# Patient Record
Sex: Male | Born: 1990 | Race: Black or African American | Hispanic: No | Marital: Single | Smoking: Current every day smoker
Health system: Southern US, Community
[De-identification: ages and names within clinical notes are randomized; demographics above are authoritative.]

## PROBLEM LIST (undated history)

## (undated) ENCOUNTER — Emergency Department (HOSPITAL_COMMUNITY): Admission: EM | Payer: Self-pay | Source: Home / Self Care

## (undated) DIAGNOSIS — F319 Bipolar disorder, unspecified: Secondary | ICD-10-CM

## (undated) DIAGNOSIS — F209 Schizophrenia, unspecified: Secondary | ICD-10-CM

## (undated) DIAGNOSIS — R569 Unspecified convulsions: Secondary | ICD-10-CM

---
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
## 2008-02-26 ENCOUNTER — Emergency Department (HOSPITAL_COMMUNITY): Admission: EM | Admit: 2008-02-26 | Discharge: 2008-02-26 | Payer: Self-pay | Admitting: Emergency Medicine

## 2008-02-28 ENCOUNTER — Emergency Department (HOSPITAL_COMMUNITY): Admission: EM | Admit: 2008-02-28 | Discharge: 2008-02-28 | Payer: Self-pay | Admitting: Emergency Medicine

## 2008-03-07 ENCOUNTER — Emergency Department (HOSPITAL_COMMUNITY): Admission: EM | Admit: 2008-03-07 | Discharge: 2008-03-07 | Payer: Self-pay | Admitting: Emergency Medicine

## 2008-03-08 ENCOUNTER — Emergency Department (HOSPITAL_COMMUNITY): Admission: EM | Admit: 2008-03-08 | Discharge: 2008-03-08 | Payer: Self-pay | Admitting: Emergency Medicine

## 2010-03-09 ENCOUNTER — Emergency Department (HOSPITAL_COMMUNITY): Admission: EM | Admit: 2010-03-09 | Discharge: 2010-03-09 | Payer: Self-pay | Admitting: Emergency Medicine

## 2010-09-06 LAB — URINALYSIS, ROUTINE W REFLEX MICROSCOPIC
Glucose, UA: NEGATIVE mg/dL
Specific Gravity, Urine: 1.025 (ref 1.005–1.030)
Urobilinogen, UA: 4 mg/dL — ABNORMAL HIGH (ref 0.0–1.0)
pH: 6 (ref 5.0–8.0)

## 2010-09-06 LAB — COMPREHENSIVE METABOLIC PANEL
AST: 30 U/L (ref 0–37)
Albumin: 4.6 g/dL (ref 3.5–5.2)
Alkaline Phosphatase: 52 U/L (ref 39–117)
BUN: 12 mg/dL (ref 6–23)
CO2: 24 mEq/L (ref 19–32)
Calcium: 10 mg/dL (ref 8.4–10.5)
Creatinine, Ser: 0.99 mg/dL (ref 0.4–1.5)
GFR calc Af Amer: >60 mL/min (ref >60)
GFR calc non Af Amer: >60 mL/min (ref >60)
Potassium: 4.1 mEq/L (ref 3.5–5.1)

## 2010-09-06 LAB — CBC
HCT: 46.2 % (ref 39.0–52.0)
MCH: 31.2 pg (ref 26.0–34.0)
MCHC: 34.3 g/dL (ref 30.0–36.0)
MCV: 90.9 fL (ref 78.0–100.0)
WBC: 7.4 10*3/uL (ref 4.0–10.5)

## 2010-09-06 LAB — POCT CARDIAC MARKERS
Myoglobin, poc: 113 ng/mL (ref 12–200)
Troponin i, poc: <0.05 ng/mL (ref 0.00–0.09)

## 2010-09-06 LAB — DIFFERENTIAL
Basophils Absolute: 0.1 10*3/uL (ref 0.0–0.1)
Basophils Relative: 1 % (ref 0–1)
Eosinophils Relative: 1 % (ref 0–5)
Monocytes Relative: 10 % (ref 3–12)

## 2010-09-06 LAB — RAPID URINE DRUG SCREEN, HOSP PERFORMED
Amphetamines: NOT DETECTED
Barbiturates: NOT DETECTED
Benzodiazepines: NOT DETECTED
Tetrahydrocannabinol: POSITIVE — AB

## 2011-02-28 ENCOUNTER — Emergency Department (HOSPITAL_COMMUNITY)
Admission: EM | Admit: 2011-02-28 | Discharge: 2011-02-28 | Discharge status: Against Medical Advice | Payer: Self-pay | Attending: Emergency Medicine | Admitting: Emergency Medicine

## 2011-02-28 DIAGNOSIS — Z5320 Procedure and treatment not carried out because of patient's decision for unspecified reasons: Secondary | ICD-10-CM | POA: Insufficient documentation

## 2011-02-28 DIAGNOSIS — R21 Rash and other nonspecific skin eruption: Secondary | ICD-10-CM | POA: Insufficient documentation

## 2011-02-28 HISTORY — DX: Unspecified convulsions: R56.9

## 2011-02-28 NOTE — ED Notes (Signed)
Pt reports has  Multiple "raw spots" to testicles for approx 1 month.  C/O burning.

## 2011-02-28 NOTE — ED Notes (Signed)
Spoke with PA. Was told pt would most likely be seen by 1300 doctor coming in. This was relayed to pt after pt spoke to RN about extended wait. Pt wished to sign out AMA. Pt ambulated out with significant other, upset. Apologized to pt fir extended wait.

## 2011-03-01 ENCOUNTER — Emergency Department (HOSPITAL_COMMUNITY)
Admission: EM | Admit: 2011-03-01 | Discharge: 2011-03-01 | Disposition: A | Discharge status: Home / Self Care | Payer: Self-pay | Attending: Emergency Medicine | Admitting: Emergency Medicine

## 2011-03-01 ENCOUNTER — Encounter (HOSPITAL_COMMUNITY): Payer: Self-pay | Admitting: Emergency Medicine

## 2011-03-01 DIAGNOSIS — J45909 Unspecified asthma, uncomplicated: Secondary | ICD-10-CM | POA: Insufficient documentation

## 2011-03-01 DIAGNOSIS — A57 Chancroid: Secondary | ICD-10-CM | POA: Insufficient documentation

## 2011-03-01 DIAGNOSIS — F17200 Nicotine dependence, unspecified, uncomplicated: Secondary | ICD-10-CM | POA: Insufficient documentation

## 2011-03-01 LAB — URINALYSIS, ROUTINE W REFLEX MICROSCOPIC
Bilirubin Urine: NEGATIVE
Glucose, UA: NEGATIVE mg/dL
Hgb urine dipstick: NEGATIVE
Specific Gravity, Urine: 1.015 (ref 1.005–1.030)
pH: 7 (ref 5.0–8.0)

## 2011-03-01 LAB — URINE MICROSCOPIC-ADD ON

## 2011-03-01 MED ORDER — DOXYCYCLINE HYCLATE 100 MG PO CAPS: 100.0000 mg | ORAL_CAPSULE | Freq: Two times a day (BID) | ORAL | Status: AC

## 2011-03-01 MED ORDER — PENICILLIN G BENZATHINE 1200000 UNIT/2ML IM SUSP
2.4000 10*6.[IU] | Freq: Once | INTRAMUSCULAR | Status: AC
  Administered 2011-03-01: 2.4 10*6.[IU] via INTRAMUSCULAR
  Filled 2011-03-01: qty 4

## 2011-03-01 MED ORDER — AZITHROMYCIN 250 MG PO TABS
1000.0000 mg | ORAL_TABLET | Freq: Once | ORAL | Status: AC
  Administered 2011-03-01: 1000 mg via ORAL
  Filled 2011-03-01: qty 4

## 2011-03-01 MED ORDER — CEFTRIAXONE SODIUM 250 MG IJ SOLR
250.0000 mg | Freq: Once | INTRAMUSCULAR | Status: AC
  Administered 2011-03-01: 250 mg via INTRAMUSCULAR
  Filled 2011-03-01: qty 250

## 2011-03-01 NOTE — ED Provider Notes (Signed)
History     CSN: [id number] Arrival date & time: 03/01/2011  2:02 PM  Chief Complaint  Patient presents with  . Rash   HPI Comments: Sex partner denies any sx's.  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  Patient is a 20 y.o. male presenting with rash. The history is provided by the patient. No language interpreter was used.  Rash  This is a new problem. Episode onset: over one month ago. The problem has been gradually worsening. Associated with: "occassional penile D/c. There has been no fever. The pain is at a severity of 4/10. The pain has been constant since onset. Associated symptoms include pain.    Past Medical History  Diagnosis Date  . Seizures   . Asthma     History reviewed. No pertinent past surgical history.  History reviewed. No pertinent family history.  History  Substance Use Topics  . Smoking status: Current Everyday Smoker  . Smokeless tobacco: Not on file  . Alcohol Use: No      Review of Systems  Skin: Positive for rash.       Scrotal lesions.  All other systems reviewed and are negative.    Physical Exam  BP 132/70  Pulse 70  Temp(Src) 98.1 F (36.7 C) (Oral)  Resp 18  Physical Exam  Nursing note and vitals reviewed. Constitutional: He is oriented to person, place, and time. Vital signs are normal. He appears well-developed and well-nourished. No distress.  HENT:  Head: Normocephalic and atraumatic.  Right Ear: External ear normal.  Left Ear: External ear normal.  Nose: Nose normal.  Mouth/Throat: No oropharyngeal exudate.  Eyes: Conjunctivae and EOM are normal. Pupils are equal, round, and reactive to light. Right eye exhibits no discharge. Left eye exhibits no discharge. No scleral icterus.  Neck: Normal range of motion. Neck supple. No JVD present. No tracheal deviation present. No thyromegaly present.  Cardiovascular: Normal rate, regular rhythm, normal heart sounds, intact distal pulses and normal pulses.  Exam reveals no gallop and no  friction rub.   No murmur heard. Pulmonary/Chest: Effort normal and breath sounds normal. No stridor. No respiratory distress. He has no wheezes. He has no rales. He exhibits no tenderness.  Abdominal: Soft. Normal appearance and bowel sounds are normal. He exhibits no distension and no mass. There is no tenderness. There is no rebound and no guarding.  Genitourinary: Testes normal and penis normal.    No penile tenderness.  Musculoskeletal: Normal range of motion. He exhibits no edema and no tenderness.  Lymphadenopathy:    He has no cervical adenopathy.       Right: Inguinal adenopathy present.       Left: Inguinal adenopathy present.  Neurological: He is alert and oriented to person, place, and time. He has normal reflexes. No cranial nerve deficit. Coordination normal. GCS eye subscore is 4. GCS verbal subscore is 5. GCS motor subscore is 6.  Skin: Skin is warm and dry. No rash noted. He is not diaphoretic.  Psychiatric: He has a normal mood and affect. His speech is normal and behavior is normal. Judgment and thought content normal. Cognition and memory are normal.    ED Course  Procedures  MDM       Worthy Rancher, PA 03/01/11 1749

## 2011-03-01 NOTE — ED Notes (Signed)
Pt reports rash to scrotum x 1 month.

## 2011-03-02 NOTE — ED Provider Notes (Signed)
Medical screening examination/treatment/procedure(s) were performed by non-physician practitioner and as supervising physician I was immediately available for consultation/collaboration.   Palin Tristan M Lyssa Hackley, DO 03/02/11 1445 

## 2011-03-06 NOTE — ED Notes (Signed)
Results received from [facility].  (+) Gonorrhea & (+) Chlamydia.  Treated with Zithromax and Rocephin in ED & Rx for Doxycycline.  [employer] form completed x 2 and faxed.  Call and notify patient.

## 2011-03-27 LAB — URINALYSIS, ROUTINE W REFLEX MICROSCOPIC
Glucose, UA: NEGATIVE
Ketones, ur: NEGATIVE
Nitrite: NEGATIVE
Specific Gravity, Urine: <1.005 — ABNORMAL LOW
pH: 6

## 2011-03-27 LAB — BASIC METABOLIC PANEL
Calcium: 9.7
GFR calc non Af Amer: >60
Potassium: 4.1
Sodium: 139

## 2011-03-27 LAB — CBC
HCT: 48.3
Hemoglobin: 16.4
Platelets: 234
WBC: 5.7

## 2011-03-27 LAB — DIFFERENTIAL
Basophils Absolute: 0
Eosinophils Relative: 2
Lymphocytes Relative: 22
Lymphs Abs: 1.3
Monocytes Absolute: 0.5
Neutro Abs: 3.9

## 2011-10-21 ENCOUNTER — Emergency Department (HOSPITAL_COMMUNITY): Payer: Self-pay

## 2011-10-21 ENCOUNTER — Encounter (HOSPITAL_COMMUNITY): Payer: Self-pay | Admitting: Emergency Medicine

## 2011-10-21 ENCOUNTER — Emergency Department (HOSPITAL_COMMUNITY)
Admission: EM | Admit: 2011-10-21 | Discharge: 2011-10-21 | Disposition: A | Discharge status: Home / Self Care | Payer: Self-pay | Attending: Emergency Medicine | Admitting: Emergency Medicine

## 2011-10-21 DIAGNOSIS — K648 Other hemorrhoids: Secondary | ICD-10-CM | POA: Insufficient documentation

## 2011-10-21 DIAGNOSIS — R51 Headache: Secondary | ICD-10-CM | POA: Insufficient documentation

## 2011-10-21 DIAGNOSIS — K5900 Constipation, unspecified: Secondary | ICD-10-CM | POA: Insufficient documentation

## 2011-10-21 HISTORY — DX: Schizophrenia, unspecified: F20.9

## 2011-10-21 HISTORY — DX: Bipolar disorder, unspecified: F31.9

## 2011-10-21 LAB — BASIC METABOLIC PANEL
CO2: 32 mEq/L (ref 19–32)
Chloride: 100 mEq/L (ref 96–112)
Creatinine, Ser: 0.92 mg/dL (ref 0.50–1.35)
GFR calc Af Amer: >90 mL/min (ref >90)
Potassium: 3.7 mEq/L (ref 3.5–5.1)
Sodium: 140 mEq/L (ref 135–145)

## 2011-10-21 MED ORDER — ACETAMINOPHEN 325 MG PO TABS
650.0000 mg | ORAL_TABLET | Freq: Once | ORAL | Status: AC
  Administered 2011-10-21: 650 mg via ORAL
  Filled 2011-10-21: qty 2

## 2011-10-21 MED ORDER — POLYETHYLENE GLYCOL 3350 17 GM/SCOOP PO POWD: 17.0000 g | Freq: Every day | ORAL | Status: AC

## 2011-10-21 MED ORDER — HYDROCORTISONE 2.5 % RE CREA: TOPICAL_CREAM | RECTAL | Status: AC

## 2011-10-21 NOTE — ED Notes (Signed)
Patient with no complaints at this time. Respirations even and unlabored. Skin warm/dry. Discharge instructions reviewed with patient at this time. Patient given opportunity to voice concerns/ask questions. Patient discharged at this time and left Emergency Department with steady gait.   

## 2011-10-21 NOTE — Discharge Instructions (Signed)
Constipation in Adults Constipation is having fewer than 2 bowel movements per week. Usually, the stools are hard. As we grow older, constipation is more common. If you try to fix constipation with laxatives, the problem may get worse. This is because laxatives taken over a long period of time make the colon muscles weaker. A low-fiber diet, not taking in enough fluids, and taking some medicines may make these problems worse. MEDICATIONS THAT MAY CAUSE CONSTIPATION  Water pills (diuretics).   Calcium channel blockers (used to control blood pressure and for the heart).   Certain pain medicines (narcotics).   Anticholinergics.   Anti-inflammatory agents.   Antacids that contain aluminum.  DISEASES THAT CONTRIBUTE TO CONSTIPATION  Diabetes.   Parkinson's disease.   Dementia.   Stroke.   Depression.   Illnesses that cause problems with salt and water metabolism.  HOME CARE INSTRUCTIONS   Constipation is usually best cared for without medicines. Increasing dietary fiber and eating more fruits and vegetables is the best way to manage constipation.   Slowly increase fiber intake to 25 to 38 grams per day. Whole grains, fruits, vegetables, and legumes are good sources of fiber. A dietitian can further help you incorporate high-fiber foods into your diet.   Drink enough water and fluids to keep your urine clear or pale yellow.   A fiber supplement may be added to your diet if you cannot get enough fiber from foods.   Increasing your activities also helps improve regularity.   Suppositories, as suggested by your caregiver, will also help. If you are using antacids, such as aluminum or calcium containing products, it will be helpful to switch to products containing magnesium if your caregiver says it is okay.   If you have been given a liquid injection (enema) today, this is only a temporary measure. It should not be relied on for treatment of longstanding (chronic) constipation.    Stronger measures, such as magnesium sulfate, should be avoided if possible. This may cause uncontrollable diarrhea. Using magnesium sulfate may not allow you time to make it to the bathroom.  SEEK IMMEDIATE MEDICAL CARE IF:   There is bright red blood in the stool.   The constipation stays for more than 4 days.   There is belly (abdominal) or rectal pain.   You do not seem to be getting better.   You have any questions or concerns.  MAKE SURE YOU:   Understand these instructions.   Will watch your condition.   Will get help right away if you are not doing well or get worse.  Document Released: 03/08/2004 Document Revised: 05/30/2011 Document Reviewed: 05/14/2011 [facility] Patient Information 2012 [place], [place].   Please take  the medications prescribed for your constipation and for the internal hemorrhoids which I suspect you may have.  Increase fluid intake, make sure you are getting plenty of fruits and vegetables and whole gr foods which will also help with constipation.  Please call Dr. Darrick Penna for further evaluation if your symptoms persist.

## 2011-10-21 NOTE — ED Notes (Signed)
Pt states having trouble going to bathroom for several months. Pt states today has headache and abd pain.

## 2011-10-22 NOTE — ED Provider Notes (Signed)
History     CSN: [id number]  Arrival date & time 10/21/11  7673   First MD Initiated Contact with Patient 10/21/11 1006      Chief Complaint  Patient presents with  . Constipation  . Headache    (Consider location/radiation/quality/duration/timing/severity/associated sxs/prior treatment) HPI Comments: Patient presents for evaluation of constipation which has been present for several months.  He also describes intermittent episodes of a "knot" at his rectum which is painful and is worse after a constipated bowel movement.  He has been treated for a hemorrhoid in the past and has used a suppository medication which he states did not improve his symptoms.  He did not take any medicines currently for his constipation.  He also has complaints of a frontal headache which started today.  He does have intermittent crampy lower abdominal pain which is worse prior to need for a bowel movement, he does not have pain at this time.  His last bowel movement was one week ago.  He denies fevers and chills, has had no mucus or blood in his bowel movements.  Patient is a 21 y.o. male presenting with constipation and headaches. The history is provided by the patient.  Constipation  Associated symptoms include rectal pain and headaches. Pertinent negatives include no fever, no abdominal pain, no nausea, no chest pain and no rash.  Headache  Pertinent negatives include no fever, no shortness of breath and no nausea.    Past Medical History  Diagnosis Date  . Seizures   . Asthma   . Bipolar disorder   . Schizophrenia     History reviewed. No pertinent past surgical history.  History reviewed. No pertinent family history.  History  Substance Use Topics  . Smoking status: Current Everyday Smoker  . Smokeless tobacco: Not on file  . Alcohol Use: No      Review of Systems  Constitutional: Negative for fever.  HENT: Negative for congestion, sore throat and neck pain.   Eyes: Negative.     Respiratory: Negative for chest tightness and shortness of breath.   Cardiovascular: Negative for chest pain.  Gastrointestinal: Positive for constipation and rectal pain. Negative for nausea and abdominal pain.  Genitourinary: Negative.   Musculoskeletal: Negative for joint swelling and arthralgias.  Skin: Negative.  Negative for rash and wound.  Neurological: Positive for headaches. Negative for dizziness, weakness, light-headedness and numbness.  Hematological: Negative.   Psychiatric/Behavioral: Negative.     Allergies  Review of patient's allergies indicates no known allergies.  Home Medications   Current Outpatient Rx  Name Route Sig Dispense Refill  . HYDROCORTISONE 2.5 % RE CREA  Apply rectally 2 times daily after warm tub soak 20 g 0  . POLYETHYLENE GLYCOL 3350 PO POWD Oral Take 17 g by mouth daily. 527 g 0    BP 114/97  Pulse 77  Temp(Src) 98.6 F (37 C) (Oral)  Resp 18  Ht 5\' 10"  (1.778 m)  Wt 160 lb (72.576 kg)  BMI 22.96 kg/m2  SpO2 99%  Physical Exam  Nursing note and vitals reviewed. Constitutional: He appears well-developed and well-nourished.  HENT:  Head: Normocephalic and atraumatic.  Eyes: Conjunctivae are normal.  Neck: Normal range of motion.  Cardiovascular: Normal rate, regular rhythm, normal heart sounds and intact distal pulses.   Pulmonary/Chest: Effort normal and breath sounds normal. He has no wheezes.  Abdominal: Soft. Bowel sounds are normal. He exhibits no distension and no mass. There is no tenderness. There is no rebound and  no guarding.  Genitourinary: Rectal exam shows no external hemorrhoid, no fissure, no mass, no tenderness and anal tone normal.       No fecal impaction appreciated.  Also unable to appreciate internal hemorrhoid at this time.  No external hemorrhoid present.  Musculoskeletal: Normal range of motion.  Neurological: He is alert. He has normal strength. No cranial nerve deficit or sensory deficit. Gait normal. GCS eye  subscore is 4. GCS verbal subscore is 5. GCS motor subscore is 6.  Skin: Skin is warm and dry.  Psychiatric: He has a normal mood and affect.    ED Course  Procedures (including critical care time)   Labs Reviewed  BASIC METABOLIC PANEL  LAB REPORT - SCANNED   Dg Abd 2 Views  10/21/2011  *RADIOLOGY REPORT*  Clinical Data: Constipation, rectal pain  ABDOMEN - 2 VIEW  Comparison: None.  Findings: There is nonspecific nonobstructive bowel gas pattern. Moderate stool noted in the hepatic flexure of the colon and transverse colon.  Some stool noted in the descending colon. Pelvic phleboliths are noted.  No free abdominal air.  IMPRESSION: 1.  Nonspecific nonobstructive bowel gas pattern.  Moderate stool noted in the hepatic flexure of the colon and transverse colon. Some stool noted in descending colon. No free abdominal air.  Original Report Authenticated By: Natasha Mead, M.D.     1. Constipation       MDM  X-ray reviewed prior to discharge home.  Although unable to appreciate internal hemorrhoid on today's exam, history suggests this probability.  Patient was prescribed Anusol suppositories and recommended warm sitz baths twice a day prior to suppository treatment.  Also prescribed MiraLax for constipation.  Referral to GI given for further management of symptoms.        Burgess Amor, [place] 10/22/11 1731

## 2011-10-23 NOTE — ED Provider Notes (Signed)
Medical screening examination/treatment/procedure(s) were performed by non-physician practitioner and as supervising physician I was immediately available for consultation/collaboration.   Junell Cullifer, MD 10/23/11 0647 

## 2015-09-04 ENCOUNTER — Emergency Department (HOSPITAL_COMMUNITY)
Admission: EM | Admit: 2015-09-04 | Discharge: 2015-09-04 | Disposition: A | Discharge status: Home / Self Care | Payer: Self-pay | Attending: Emergency Medicine | Admitting: Emergency Medicine

## 2015-09-04 ENCOUNTER — Encounter (HOSPITAL_COMMUNITY): Payer: Self-pay | Admitting: *Deleted

## 2015-09-04 DIAGNOSIS — F319 Bipolar disorder, unspecified: Secondary | ICD-10-CM | POA: Insufficient documentation

## 2015-09-04 DIAGNOSIS — M62838 Other muscle spasm: Secondary | ICD-10-CM | POA: Insufficient documentation

## 2015-09-04 DIAGNOSIS — J45909 Unspecified asthma, uncomplicated: Secondary | ICD-10-CM | POA: Insufficient documentation

## 2015-09-04 DIAGNOSIS — F17200 Nicotine dependence, unspecified, uncomplicated: Secondary | ICD-10-CM | POA: Insufficient documentation

## 2015-09-04 LAB — I-STAT TROPONIN, ED: Troponin i, poc: 0 ng/mL (ref 0.00–0.08)

## 2015-09-04 LAB — I-STAT CHEM 8, ED
BUN: 13 mg/dL (ref 6–20)
CALCIUM ION: 1.2 mmol/L (ref 1.12–1.23)
CHLORIDE: 98 mmol/L — AB (ref 101–111)
CREATININE: 0.9 mg/dL (ref 0.61–1.24)
GLUCOSE: 84 mg/dL (ref 65–99)
HCT: 48 % (ref 39.0–52.0)
Hemoglobin: 16.3 g/dL (ref 13.0–17.0)
Potassium: 3.8 mmol/L (ref 3.5–5.1)
SODIUM: 141 mmol/L (ref 135–145)
TCO2: 30 mmol/L (ref 0–100)

## 2015-09-04 NOTE — ED Notes (Signed)
Patient verbalizes understanding of discharge instructions, home care and follow up care if needed. Patient out of department at this time. 

## 2015-09-04 NOTE — ED Notes (Signed)
Patient c/o left neck pain that radiated into left chest and left arm for "a few days" patient denies dizziness, nausea, vomiting or weakness. A&OX4 at this time, visitor at bedside.

## 2015-09-04 NOTE — ED Provider Notes (Signed)
CSN: [id number]     Arrival date & time 09/04/15  1946 History   First MD Initiated Contact with Patient 09/04/15 2009     Chief Complaint  Patient presents with  . Chest Pain      HPI Pt was seen at 2015. Per pt, c/o gradual onset and persistence of constant left sided neck "spasms" that began 3 days ago. Pt states his left neck muscle "feels like it cramps up then lets go." Pt states his symptoms began after he "began sleeping on a really flat pillow." Denies injury, no fevers, no rash, no CP/SOB, no focal motor weakness, no tingling/numbness in extremities, no abd pain, no N/V/D, no dysphagia, no sore throat.    Past Medical History  Diagnosis Date  . Seizures (HCC)   . Asthma   . Bipolar disorder (HCC)   . Schizophrenia (HCC)    History reviewed. No pertinent past surgical history.  Social History  Substance Use Topics  . Smoking status: Current Every Day Smoker  . Smokeless tobacco: None  . Alcohol Use: No    Review of Systems ROS: Statement: All systems negative except as marked or noted in the HPI; Constitutional: Negative for fever and chills. ; ; Eyes: Negative for eye pain, redness and discharge. ; ; ENMT: Negative for ear pain, hoarseness, nasal congestion, sinus pressure and sore throat. ; ; Cardiovascular: Negative for chest pain, palpitations, diaphoresis, dyspnea and peripheral edema. ; ; Respiratory: Negative for cough, wheezing and stridor. ; ; Gastrointestinal: Negative for nausea, vomiting, diarrhea, abdominal pain, blood in stool, hematemesis, jaundice and rectal bleeding. . ; ; Genitourinary: Negative for dysuria, flank pain and hematuria. ; ; Musculoskeletal: +"muscle spasm." Negative for back pain and neck pain. Negative for swelling and trauma.; ; Skin: Negative for pruritus, rash, abrasions, blisters, bruising and skin lesion.; ; Neuro: Negative for headache, lightheadedness and neck stiffness. Negative for weakness, altered level of consciousness , altered  mental status, extremity weakness, paresthesias, involuntary movement, seizure and syncope.      Allergies  Review of patient's allergies indicates no known allergies.  Home Medications   Prior to Admission medications   Not on File   BP 108/82 mmHg  Pulse 97  Temp(Src) 98.2 F (36.8 C) (Oral)  Resp 18  Wt 150 lb 11.2 oz (68.357 kg)  SpO2 100% Physical Exam  2050: Physical examination:  Nursing notes reviewed; Vital signs and O2 SAT reviewed;  Constitutional: Well developed, Well nourished, Well hydrated, In no acute distress; Head:  Normocephalic, atraumatic; Eyes: EOMI, PERRL, No scleral icterus; ENMT: Mouth and pharynx normal, Mucous membranes moist; Neck: Supple, Full range of motion, No lymphadenopathy. No palp thrill, no bruit. No rash. No edema, no ecchymosis.; Cardiovascular: Regular rate and rhythm, No murmur, rub, or gallop; Respiratory: Breath sounds clear & equal bilaterally, No rales, rhonchi, wheezes.  Speaking full sentences with ease, Normal respiratory effort/excursion; Chest: Nontender, Movement normal; Abdomen: Soft, Nontender, Nondistended, Normal bowel sounds; Genitourinary: No CVA tenderness; Spine:  No midline CS, TS, LS tenderness.;; Extremities: Pulses normal, No tenderness, No edema, No calf edema or asymmetry.; Neuro: AA&Ox3, Major CN grossly intact.  Speech clear. No gross focal motor or sensory deficits in extremities. Climbs on and off stretcher easily by himself. Gait steady.; Skin: Color normal, Warm, Dry.   ED Course  Procedures (including critical care time) Labs Review  Imaging Review  I have personally reviewed and evaluated these images and lab results as part of my medical decision-making.   EKG  Interpretation   Date/Time:  Monday September 04 2015 19:57:29 EDT Ventricular Rate:  74 PR Interval:  126 QRS Duration: 102 QT Interval:  362 QTC Calculation: 401 R Axis:   86 Text Interpretation:  Normal sinus rhythm with sinus arrhythmia RSR' or  QR  pattern in V1 suggests right ventricular conduction delay When compared  with ECG of 03/09/2010 No significant change was found Confirmed by [facility]   MD, Nicholos JohnsKATHLEEN [phone]([id number]) on 09/04/2015 8:33:20 PM      MDM  MDM Reviewed: previous chart, nursing note and vitals Reviewed previous: labs and ECG Interpretation: labs and ECG     Results for orders placed or performed during the hospital encounter of 09/04/15  I-stat Chem 8, ED  Result Value Ref Range   Sodium 141 135 - 145 mmol/L   Potassium 3.8 3.5 - 5.1 mmol/L   Chloride 98 (L) 101 - 111 mmol/L   BUN 13 6 - 20 mg/dL   Creatinine, Ser 7.840.90 0.61 - 1.24 mg/dL   Glucose, Bld 84 65 - 99 mg/dL   Calcium, Ion 6.961.20 1.12 - 1.23 mmol/L   TCO2 30 0 - 100 mmol/L   Hemoglobin 16.3 13.0 - 17.0 g/dL   HCT 29.548.0 28.439.0 - 13.252.0 %  I-stat troponin, ED  Result Value Ref Range   Troponin i, poc 0.00 0.00 - 0.08 ng/mL   Comment 3            2105:  Workup and exam reassuring. Doubt acute vascular issue as cause for pain; as pt is TTP over left SCM muscle. Pt states he "feels better" and wants to go home now. Tx symptomatically. Dx and testing d/w pt and family.  Questions answered.  Verb understanding, agreeable to d/c home with outpt f/u.     Samuel JesterKathleen Rhaya Coale, DO 09/08/15 1720

## 2015-09-04 NOTE — ED Notes (Signed)
Pt c/o neck pain that radiates down left side of chest and down left arm; pt states "I just don't feel right"'

## 2015-09-04 NOTE — Discharge Instructions (Signed)
Take over the counter tylenol and ibuprofen, as directed on packaging, as needed for discomfort.  Apply moist heat or ice to the area(s) of discomfort, for 15 minutes at a time, several times per day for the next few days.  Do not fall asleep on a heating or ice pack.  Call your regular medical doctor tomorrow to schedule a follow up appointment in the next 2 to 3 days.  Return to the Emergency Department immediately if worsening. ° ° °

## 2021-03-17 ENCOUNTER — Encounter (HOSPITAL_COMMUNITY): Payer: Self-pay

## 2021-03-17 ENCOUNTER — Emergency Department (HOSPITAL_COMMUNITY)
Admission: EM | Admit: 2021-03-17 | Discharge: 2021-03-17 | Disposition: A | Discharge status: Home / Self Care | Payer: Self-pay | Attending: Emergency Medicine | Admitting: Emergency Medicine

## 2021-03-17 ENCOUNTER — Other Ambulatory Visit: Payer: Self-pay

## 2021-03-17 DIAGNOSIS — Z202 Contact with and (suspected) exposure to infections with a predominantly sexual mode of transmission: Secondary | ICD-10-CM | POA: Insufficient documentation

## 2021-03-17 DIAGNOSIS — Z113 Encounter for screening for infections with a predominantly sexual mode of transmission: Secondary | ICD-10-CM

## 2021-03-17 DIAGNOSIS — F17210 Nicotine dependence, cigarettes, uncomplicated: Secondary | ICD-10-CM | POA: Insufficient documentation

## 2021-03-17 DIAGNOSIS — J45909 Unspecified asthma, uncomplicated: Secondary | ICD-10-CM | POA: Insufficient documentation

## 2021-03-17 LAB — RAPID HIV SCREEN (HIV 1/2 AB+AG)
HIV 1/2 Antibodies: NONREACTIVE
HIV-1 P24 Antigen - HIV24: NONREACTIVE

## 2021-03-17 NOTE — ED Provider Notes (Addendum)
[facility] EMERGENCY DEPARTMENT Provider Note   CSN: [id number] Arrival date & time: 03/17/21  1840     History Chief Complaint  Patient presents with   Exposure to STD    Cody Massey is a 30 y.o. male.  Pt reports he wants to get checked.  Pt denies any symptoms.  Pt denies partner having any symptoms.  No denies discharge   The history is provided by the patient. No language interpreter was used.  Exposure to STD This is a new problem. The current episode started more than 2 days ago. The problem occurs constantly. The problem has not changed since onset.Pertinent negatives include no abdominal pain and no headaches. Nothing aggravates the symptoms. Nothing relieves the symptoms. He has tried nothing for the symptoms. The treatment provided no relief.      Past Medical History:  Diagnosis Date   Asthma    Bipolar disorder (HCC)    Schizophrenia (HCC)    Seizures (HCC)     There are no problems to display for this patient.   History reviewed. No pertinent surgical history.     No family history on file.  Social History   Tobacco Use   Smoking status: Every Day    Packs/day: 1.00    Types: Cigarettes   Smokeless tobacco: Never  Vaping Use   Vaping Use: Never used  Substance Use Topics   Alcohol use: No   Drug use: Yes    Types: Marijuana    Home Medications Prior to Admission medications   Not on File    Allergies    Patient has no known allergies.  Review of Systems   Review of Systems  Gastrointestinal:  Negative for abdominal pain.  Neurological:  Negative for headaches.  All other systems reviewed and are negative.  Physical Exam Updated Vital Signs BP 122/72 (BP Location: Right Arm)   Pulse 82   Temp 98.4 F (36.9 C) (Oral)   Resp 18   Ht 5\' 11"  (1.803 m)   Wt 74.3 kg   SpO2 98%   BMI 22.85 kg/m   Physical Exam Vitals reviewed.  Cardiovascular:     Rate and Rhythm: Normal rate.  Pulmonary:     Effort: Pulmonary effort  is normal.  Genitourinary:    Penis: Normal.   Musculoskeletal:        General: Normal range of motion.  Skin:    General: Skin is warm.  Neurological:     General: No focal deficit present.     Mental Status: He is alert.  Psychiatric:        Mood and Affect: Mood normal.    ED Results / Procedures / Treatments   Labs (all labs ordered are listed, but only abnormal results are displayed) Labs Reviewed  HIV ANTIBODY (ROUTINE TESTING W REFLEX)  RAPID HIV SCREEN (HIV 1/2 AB+AG)  GC/CHLAMYDIA PROBE AMP ([place]) NOT AT [facility]    EKG None  Radiology No results found.  Procedures Procedures   Medications Ordered in ED Medications - No data to display  ED Course  I have reviewed the triage vital signs and the nursing notes.  Pertinent labs & imaging results that were available during my care of the patient were reviewed by me and considered in my medical decision making (see chart for details).    MDM Rules/Calculators/A&P  GC/Ct/ RPR and HIv ordered  Final Clinical Impression(s) / ED Diagnoses Final diagnoses:  Screen for STD (sexually transmitted disease)    Rx / DC Orders ED Discharge Orders     None     An After Visit Summary was printed and given to the patient.    Elson Areas, PA-C 03/17/21 2247    Elson Areas, PA-C 03/17/21 2248    Bethann Berkshire, MD 03/19/21 1151

## 2021-03-17 NOTE — ED Triage Notes (Signed)
Pt here from home for STD check- pt denies symptoms, "just wants to be checked"

## 2021-03-18 LAB — HIV ANTIBODY (ROUTINE TESTING W REFLEX): HIV Screen 4th Generation wRfx: NONREACTIVE

## 2021-03-19 LAB — GC/CHLAMYDIA PROBE AMP (~~LOC~~) NOT AT ARMC
Chlamydia: NEGATIVE
Comment: NEGATIVE
Comment: NORMAL
Neisseria Gonorrhea: NEGATIVE

## 2022-02-09 ENCOUNTER — Emergency Department (HOSPITAL_COMMUNITY)
Admission: EM | Admit: 2022-02-09 | Discharge: 2022-02-09 | Discharge status: Against Medical Advice | Payer: 59 | Attending: Emergency Medicine | Admitting: Emergency Medicine

## 2022-02-09 ENCOUNTER — Encounter (HOSPITAL_COMMUNITY): Payer: Self-pay

## 2022-02-09 DIAGNOSIS — Z5321 Procedure and treatment not carried out due to patient leaving prior to being seen by health care provider: Secondary | ICD-10-CM | POA: Diagnosis not present

## 2022-02-09 DIAGNOSIS — K0889 Other specified disorders of teeth and supporting structures: Secondary | ICD-10-CM | POA: Insufficient documentation

## 2022-02-09 NOTE — ED Triage Notes (Signed)
Pt states that he is having right lower sided dental pain x weeks. Pt has not seen a dentist in a while- would like referral.

## 2022-05-18 ENCOUNTER — Other Ambulatory Visit: Payer: Self-pay

## 2022-05-18 ENCOUNTER — Emergency Department (HOSPITAL_COMMUNITY)
Admission: EM | Admit: 2022-05-18 | Discharge: 2022-05-18 | Discharge status: Against Medical Advice | Payer: 59 | Attending: Emergency Medicine | Admitting: Emergency Medicine

## 2022-05-18 DIAGNOSIS — Z5329 Procedure and treatment not carried out because of patient's decision for other reasons: Secondary | ICD-10-CM | POA: Insufficient documentation

## 2022-05-18 DIAGNOSIS — K0889 Other specified disorders of teeth and supporting structures: Secondary | ICD-10-CM | POA: Insufficient documentation

## 2022-05-18 NOTE — ED Triage Notes (Signed)
Pt c/o pain to lower left teeth, states its been hurting for about a year. Denies seeing a dentist.

## 2022-05-18 NOTE — ED Provider Notes (Signed)
  [facility] EMERGENCY DEPARTMENT Provider Note   CSN: [id number] Arrival date & time: 05/18/22  0340     History  Chief Complaint  Patient presents with   Dental Pain    Cody Massey is a 31 y.o. male.  Patient is a 31 year old male presenting with dental pain ongoing for the past year.  He denies any fevers or chills.  He denies any difficulty breathing or swallowing.  He does report pain with eating or drinking.  The history is provided by the patient.       Home Medications Prior to Admission medications   Not on File      Allergies    Patient has no known allergies.    Review of Systems   Review of Systems  All other systems reviewed and are negative.   Physical Exam Updated Vital Signs BP (!) 143/82   Pulse 72   Temp 98 F (36.7 C) (Oral)   Resp 17   Ht 5\' 6"  (1.676 m)   Wt 72 kg   SpO2 100%   BMI 25.62 kg/m  Physical Exam Vitals and nursing note reviewed.  Constitutional:      General: He is not in acute distress.    Appearance: Normal appearance. He is not ill-appearing.  HENT:     Head: Normocephalic and atraumatic.     Mouth/Throat:     Mouth: Mucous membranes are moist.     Comments: The lower wisdom teeth are decayed and partially impacted.  There is surrounding gingival inflammation. Pulmonary:     Effort: Pulmonary effort is normal.  Skin:    General: Skin is warm and dry.  Neurological:     Mental Status: He is alert.     ED Results / Procedures / Treatments   Labs (all labs ordered are listed, but only abnormal results are displayed) Labs Reviewed - No data to display  EKG None  Radiology No results found.  Procedures Procedures    Medications Ordered in ED Medications - No data to display  ED Course/ Medical Decision Making/ A&P  Patient presenting with dental pain as described in the HPI.  This has been ongoing for the past year.  Patient advised I would prescribe antibiotics and give him follow-up  information for dentistry.  Patient was on pleased with this plan and stated that he "never should have come here".  I am uncertain as to what he was expecting, but I informed him I did not have a dentist in the ER at 4 AM to tend to his issues and this seemed to upset him.  Patient then eloped from the emergency department.  Final Clinical Impression(s) / ED Diagnoses Final diagnoses:  None    Rx / DC Orders ED Discharge Orders     None         , MD 05/18/22 [phone]

## 2022-05-18 NOTE — ED Notes (Signed)
EDP entered treatment room to assess Pt. Pt became frustrated immediately and got up and walked out of the ED.
